# Patient Record
Sex: Male | Born: 1960 | Race: Black or African American | Hispanic: No | Marital: Married | Smoking: Never smoker
Health system: Southern US, Community
[De-identification: ages and names within clinical notes are randomized; demographics above are authoritative.]

## PROBLEM LIST (undated history)

## (undated) DIAGNOSIS — I10 Essential (primary) hypertension: Secondary | ICD-10-CM

---

## 2015-12-17 ENCOUNTER — Emergency Department (HOSPITAL_BASED_OUTPATIENT_CLINIC_OR_DEPARTMENT_OTHER): Payer: Self-pay

## 2015-12-17 ENCOUNTER — Emergency Department (HOSPITAL_BASED_OUTPATIENT_CLINIC_OR_DEPARTMENT_OTHER)
Admission: EM | Admit: 2015-12-17 | Discharge: 2015-12-18 | Disposition: A | Discharge status: Home / Self Care | Payer: Self-pay | Attending: Physician Assistant | Admitting: Physician Assistant

## 2015-12-17 ENCOUNTER — Encounter (HOSPITAL_BASED_OUTPATIENT_CLINIC_OR_DEPARTMENT_OTHER): Payer: Self-pay | Admitting: Emergency Medicine

## 2015-12-17 DIAGNOSIS — Z79899 Other long term (current) drug therapy: Secondary | ICD-10-CM | POA: Insufficient documentation

## 2015-12-17 DIAGNOSIS — R51 Headache: Secondary | ICD-10-CM | POA: Insufficient documentation

## 2015-12-17 DIAGNOSIS — M545 Low back pain: Secondary | ICD-10-CM | POA: Insufficient documentation

## 2015-12-17 DIAGNOSIS — R350 Frequency of micturition: Secondary | ICD-10-CM | POA: Insufficient documentation

## 2015-12-17 DIAGNOSIS — I10 Essential (primary) hypertension: Secondary | ICD-10-CM | POA: Insufficient documentation

## 2015-12-17 DIAGNOSIS — R6889 Other general symptoms and signs: Secondary | ICD-10-CM

## 2015-12-17 DIAGNOSIS — R200 Anesthesia of skin: Secondary | ICD-10-CM | POA: Insufficient documentation

## 2015-12-17 DIAGNOSIS — R42 Dizziness and giddiness: Secondary | ICD-10-CM | POA: Insufficient documentation

## 2015-12-17 HISTORY — DX: Essential (primary) hypertension: I10

## 2015-12-17 MED ORDER — SODIUM CHLORIDE 0.9 % IV BOLUS (SEPSIS)
1000.0000 mL | Freq: Once | INTRAVENOUS | Status: AC
  Administered 2015-12-18: 1000 mL via INTRAVENOUS

## 2015-12-17 NOTE — ED Notes (Signed)
Pt here with cousin interpreting. Has not been feeling well x 1 week. H/A this a.m. Got up and "fell over". No problems urinating, but no BM x 3-4 days. Abd distended. "Swells after eating". Neuro WNL.

## 2015-12-17 NOTE — ED Notes (Signed)
Pt only speaks [place], spoke with [place] creole interpreter id # [phone], per patient he states he has difficulty ambulating and becomes dizzy when walking, pt reports the has been in [place] for 3 months, has been to 4 different hospitals in last 2 months for problem and has just been given bp medication, he then stated that he has continued to have stomach problems and goes to bathroom every 3 days for bm, pt unable to tell me exact problem of stomach problems prior to [place], pt denies muscle or bone problem or injury for difficulty walking

## 2015-12-17 NOTE — ED Provider Notes (Addendum)
CSN: [id number]     Arrival date & time 12/17/15  1738 History  By signing my name below, I, Arianna Nassar, attest that this documentation has been prepared under the direction and in the presence of Thatiana Renbarger Randall An, MD. Electronically Signed: Octavia Heir, ED Scribe. 12/17/2015. 10:23 PM.      Chief Complaint  Patient presents with  . Dizziness  . Difficulty Walking      The history is provided by the patient. A language interpreter was used (#[id number]).   HPI Comments: Gabriel Ruiz is a 55 y.o. male who has a hx of HTN presents to the Emergency Department complaining of constant, gradual worsening dizziness with associated headache, lower back pain, and difficulty walking onset 3 months ago. He says his lower legs feel numb. Pt states he has seen three doctors for the same symptoms without finding the problems. He says that he has difficulty ambulating due to increased pain in lower back and legs. Pt reports that whenever he eats, he feels very bloated and he states having frequent urination about every 35 minutes. Pt is worried that he has a BM every 3-4 days. Pt has been in the [place] from [place] for about 3 years and he did not have this problem before. He denies muscle or bone injury.  Past Medical History  Diagnosis Date  . Hypertension    History reviewed. No pertinent past surgical history. History reviewed. No pertinent family history. Social History  Substance Use Topics  . Smoking status: Never Smoker   . Smokeless tobacco: None  . Alcohol Use: No    Review of Systems  Genitourinary: Positive for frequency.  Neurological: Positive for dizziness, numbness and headaches.  All other systems reviewed and are negative.     Allergies  Review of patient's allergies indicates no known allergies.  Home Medications   Prior to Admission medications   Medication Sig Start Date End Date Taking? Authorizing Provider  hydrochlorothiazide (HYDRODIURIL) 25  MG tablet Take 25 mg by mouth daily.   Yes Historical Provider, MD  omeprazole (PRILOSEC) 20 MG capsule Take 20 mg by mouth daily.   Yes Historical Provider, MD  traMADol (ULTRAM) 50 MG tablet Take by mouth every 6 (six) hours as needed.   Yes Historical Provider, MD   Triage vitals: BP 132/87 mmHg  Pulse 93  Temp(Src) 98 F (36.7 C) (Oral)  Resp 18  Ht  (1.676 m)  Wt 190 lb (86.183 kg)  BMI 30.68 kg/m2  SpO2 98% Physical Exam  Constitutional: He is oriented to person, place, and time. He appears well-developed and well-nourished.  HENT:  Head: Normocephalic and atraumatic.  Mouth/Throat: Oropharynx is clear and moist.  Eyes: Conjunctivae and EOM are normal. Pupils are equal, round, and reactive to light.  Neck: Normal range of motion.  Cardiovascular: Normal rate, regular rhythm and normal heart sounds.   Pulmonary/Chest: Effort normal and breath sounds normal.  Abdominal: Soft. Bowel sounds are normal.  Musculoskeletal: Normal range of motion.  Neurological: He is alert and oriented to person, place, and time.  Skin: Skin is warm and dry.  Psychiatric: He has a normal mood and affect.  Nursing note and vitals reviewed.   ED Course  Procedures  DIAGNOSTIC STUDIES: Oxygen Saturation is 98% on RA, normal by my interpretation.  COORDINATION OF CARE:  10:19 PM Discussed treatment plan with pt at bedside and pt agreed to plan.  Labs Review Labs Reviewed  URINE CULTURE  CBC WITH DIFFERENTIAL/PLATELET  COMPREHENSIVE METABOLIC PANEL  URINALYSIS, ROUTINE W REFLEX MICROSCOPIC (NOT AT [facility])    Imaging Review No results found. I have personally reviewed and evaluated these images and lab results as part of my medical decision-making.   EKG Interpretation None      MDM   Final diagnoses:  None    Patient is a 55 year old [place] speaking male. Intervening the patient was incredibly difficult. Patient was pan positive. Everything that you asked the patient he would  endorse as a symptom. For example when asked about bowel movements he says he only has one every 2-3 days. Or when asked about urination he says he urinates every 45 minutes. Patient says he's had a workup done at 3 different hospitals within last month. Reportedly none of them have found anything acutely needing treatment. When asked to narrow down what his complaints are he said mostly his headache and pain in his back.  We will get CT head, x-ray of his spine. Patient's physical exam showed no weakness or trouble with sensation. Cranial nerves are intact.  [facility] because the story and hisoty is so incredibly vague even with an interperter that I am having trouble directing testing.   We will get baseline labs, CT head and x-ray of his spine. This is all normal we'll have him try to establish care with her primary care physician.  Could not get labs. Did US guided fem stick.   Normal vital signs and physical exam except for scattered lesions on mouth.   I personally performed the services described in this documentation, which was scribed in my presence. The recorded information has been reviewed and is accurate.   Labs and all imaging pending, signed out to oncoming provider.   12:15 AM [facility]. 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 Nothing to be done. 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     Yer Olivencia Randall AnLyn Mylez Venable, MD 12/18/15 0000  Junious Ragone Randall AnLyn Anakaren Campion, MD 12/18/15 [id number]

## 2015-12-17 NOTE — ED Notes (Signed)
Femoral vein stick done by Dr. Corlis LeakMacKuen using Ultrasound.

## 2015-12-18 LAB — COMPREHENSIVE METABOLIC PANEL
ALBUMIN: 3.9 g/dL (ref 3.5–5.0)
ALK PHOS: 52 U/L (ref 38–126)
ALT: 25 U/L (ref 17–63)
AST: 43 U/L — AB (ref 15–41)
Anion gap: 8 (ref 5–15)
BILIRUBIN TOTAL: 0.6 mg/dL (ref 0.3–1.2)
BUN: 26 mg/dL — AB (ref 6–20)
CO2: 26 mmol/L (ref 22–32)
CREATININE: 1.39 mg/dL — AB (ref 0.61–1.24)
Calcium: 9.3 mg/dL (ref 8.9–10.3)
Chloride: 98 mmol/L — ABNORMAL LOW (ref 101–111)
GFR calc Af Amer: >60 mL/min (ref >60)
GFR, EST NON AFRICAN AMERICAN: 57 mL/min — AB (ref >60)
GLUCOSE: 101 mg/dL — AB (ref 65–99)
Potassium: 3.7 mmol/L (ref 3.5–5.1)
Sodium: 132 mmol/L — ABNORMAL LOW (ref 135–145)
TOTAL PROTEIN: 8.2 g/dL — AB (ref 6.5–8.1)

## 2015-12-18 LAB — URINALYSIS, ROUTINE W REFLEX MICROSCOPIC
Bilirubin Urine: NEGATIVE
GLUCOSE, UA: NEGATIVE mg/dL
HGB URINE DIPSTICK: NEGATIVE
KETONES UR: NEGATIVE mg/dL
Leukocytes, UA: NEGATIVE
Nitrite: NEGATIVE
PROTEIN: NEGATIVE mg/dL
Specific Gravity, Urine: 1.011 (ref 1.005–1.030)
pH: 6.5 (ref 5.0–8.0)

## 2015-12-18 LAB — CBC WITH DIFFERENTIAL/PLATELET
BASOS ABS: 0 10*3/uL (ref 0.0–0.1)
Basophils Relative: 1 %
Eosinophils Absolute: 0.1 10*3/uL (ref 0.0–0.7)
Eosinophils Relative: 3 %
HEMATOCRIT: 33 % — AB (ref 39.0–52.0)
HEMOGLOBIN: 11.5 g/dL — AB (ref 13.0–17.0)
LYMPHS PCT: 32 %
Lymphs Abs: 1.1 10*3/uL (ref 0.7–4.0)
MCH: 33.5 pg (ref 26.0–34.0)
MCHC: 34.8 g/dL (ref 30.0–36.0)
MCV: 96.2 fL (ref 78.0–100.0)
MONO ABS: 0.5 10*3/uL (ref 0.1–1.0)
Monocytes Relative: 15 %
NEUTROS ABS: 1.7 10*3/uL (ref 1.7–7.7)
NEUTROS PCT: 49 %
Platelets: 137 10*3/uL — ABNORMAL LOW (ref 150–400)
RBC: 3.43 MIL/uL — AB (ref 4.22–5.81)
RDW: 12.1 % (ref 11.5–15.5)
WBC: 3.3 10*3/uL — AB (ref 4.0–10.5)

## 2015-12-18 NOTE — ED Provider Notes (Addendum)
Nursing notes and vitals signs, including pulse oximetry, reviewed.  Summary of this visit's results, reviewed by myself:   EKG Interpretation  Date/Time:  Wednesday December 18 2015 01:23:12 EDT Ventricular Rate:  86 PR Interval:  178 QRS Duration: 99 QT Interval:  366 QTC Calculation: 438 R Axis:   17 Text Interpretation:  Sinus rhythm ST elev, probable normal early repol pattern Baseline wander in lead(s) V4 V5 No previous ECGs available Confirmed by Poetry Cerro  MD, Jonny RuizJOHN ([id number]) on 12/18/2015 1:25:53 AM       Labs:  Results for orders placed or performed during the hospital encounter of 12/17/15 (from the past 24 hour(s))  CBC with Differential/Platelet     Status: Abnormal   Collection Time: 12/17/15 11:50 PM  Result Value Ref Range   WBC 3.3 (L) 4.0 - 10.5 K/uL   RBC 3.43 (L) 4.22 - 5.81 MIL/uL   Hemoglobin 11.5 (L) 13.0 - 17.0 g/dL   HCT 65.733.0 (L) 84.639.0 - 96.252.0 %   MCV 96.2 78.0 - 100.0 fL   MCH 33.5 26.0 - 34.0 pg   MCHC 34.8 30.0 - 36.0 g/dL   RDW 95.212.1 84.111.5 - 32.415.5 %   Platelets 137 (L) 150 - 400 K/uL   Neutrophils Relative % 49 %   Neutro Abs 1.7 1.7 - 7.7 K/uL   Lymphocytes Relative 32 %   Lymphs Abs 1.1 0.7 - 4.0 K/uL   Monocytes Relative 15 %   Monocytes Absolute 0.5 0.1 - 1.0 K/uL   Eosinophils Relative 3 %   Eosinophils Absolute 0.1 0.0 - 0.7 K/uL   Basophils Relative 1 %   Basophils Absolute 0.0 0.0 - 0.1 K/uL  Comprehensive metabolic panel     Status: Abnormal   Collection Time: 12/17/15 11:50 PM  Result Value Ref Range   Sodium 132 (L) 135 - 145 mmol/L   Potassium 3.7 3.5 - 5.1 mmol/L   Chloride 98 (L) 101 - 111 mmol/L   CO2 26 22 - 32 mmol/L   Glucose, Bld 101 (H) 65 - 99 mg/dL   BUN 26 (H) 6 - 20 mg/dL   Creatinine, Ser 4.011.39 (H) 0.61 - 1.24 mg/dL   Calcium 9.3 8.9 - 02.710.3 mg/dL   Total Protein 8.2 (H) 6.5 - 8.1 g/dL   Albumin 3.9 3.5 - 5.0 g/dL   AST 43 (H) 15 - 41 U/L   ALT 25 17 - 63 U/L   Alkaline Phosphatase 52 38 - 126 U/L   Total Bilirubin 0.6 0.3  - 1.2 mg/dL   GFR calc non Af Amer 57 (L) >60 mL/min   GFR calc Af Amer >60 >60 mL/min   Anion gap 8 5 - 15  Urinalysis, Routine w reflex microscopic (not at [facility])     Status: Abnormal   Collection Time: 12/18/15 12:15 AM  Result Value Ref Range   Color, Urine YELLOW YELLOW   APPearance CLOUDY (A) CLEAR   Specific Gravity, Urine 1.011 1.005 - 1.030   pH 6.5 5.0 - 8.0   Glucose, UA NEGATIVE NEGATIVE mg/dL   Hgb urine dipstick NEGATIVE NEGATIVE   Bilirubin Urine NEGATIVE NEGATIVE   Ketones, ur NEGATIVE NEGATIVE mg/dL   Protein, ur NEGATIVE NEGATIVE mg/dL   Nitrite NEGATIVE NEGATIVE   Leukocytes, UA NEGATIVE NEGATIVE    Imaging Studies: Dg Chest 2 View  12/18/2015  CLINICAL DATA:  Constant gradual worsening dizziness with headache, chest pain, back pain, low leg pain and numbness. Onset 3 months ago. EXAM: CHEST  2  VIEW COMPARISON:  11/02/2015 FINDINGS: Shallow inspiration with atelectasis in the lung bases. No focal airspace disease or consolidation in the lungs. No blunting of costophrenic angles. No pneumothorax. Heart size and pulmonary vascularity are normal. IMPRESSION: Shallow inspiration with atelectasis in the lung bases. Electronically Signed   By: Burman Nieves M.D.   On: 12/18/2015 00:56   Dg Lumbar Spine Complete  12/18/2015  CLINICAL DATA:  55 year old male with back pain and difficulty walking. EXAM: LUMBAR SPINE - COMPLETE 4+ VIEW COMPARISON:  None. FINDINGS: There is mild compression deformity of the L4 vertebra, age indeterminate, likely chronic. Clinical correlation is recommended. A small density noted along the posterior and inferior corner of the L4 vertebra with apparent mild protrusion of the neural foramina at this level. MRI may provide better evaluation. No definite acute vertebral body fracture or subluxation identified. The visualized transverse and spinous processes appear intact. Copious amount of dense stool noted throughout the colon. IMPRESSION: Apparent  faint density along the posterior and inferior corner of the L4 vertebra protruding onto the neural foramina. MRI may provide better evaluation No definite acute vertebral body fracture or subluxation. Electronically Signed   By: Elgie Collard M.D.   On: 12/18/2015 00:56   Ct Head Wo Contrast  12/18/2015  CLINICAL DATA:  55 year old male with dizziness.  No head injury. EXAM: CT HEAD WITHOUT CONTRAST TECHNIQUE: Contiguous axial images were obtained from the base of the skull through the vertex without intravenous contrast. COMPARISON:  None. FINDINGS: The ventricles and the sulci are appropriate in size for the patient's age. There is no intracranial hemorrhage. No midline shift or mass effect identified. The gray-white matter differentiation is preserved. The visualized paranasal sinuses and mastoid air cells are well aerated. The calvarium is intact. IMPRESSION: No acute intracranial pathology. Electronically Signed   By: Elgie Collard M.D.   On: 12/18/2015 00:46   The patient's mild leukopenia has been noted on previous CBCs and has been worse in the past. There are no lab or x-ray findings suggesting a need for admission or emergent intervention.   Paula Libra, MD 12/18/15 [id number]  Paula Libra, MD 12/18/15 0130

## 2015-12-18 NOTE — ED Notes (Signed)
By use of interpreter services, pt was given d/c instructions. Verbalized understanding. No questions.

## 2015-12-19 LAB — URINE CULTURE: CULTURE: NO GROWTH

## 2017-03-20 IMAGING — CR DG CHEST 2V
3 series · 3 of 3 positions shown · non-contrast
Comparison: 11/02/2015

CLINICAL DATA: Constant gradual worsening dizziness with headache,
chest pain, back pain, low leg pain and numbness. Onset 3 months
ago.

EXAM:
CHEST  2 VIEW

[w chest pa]
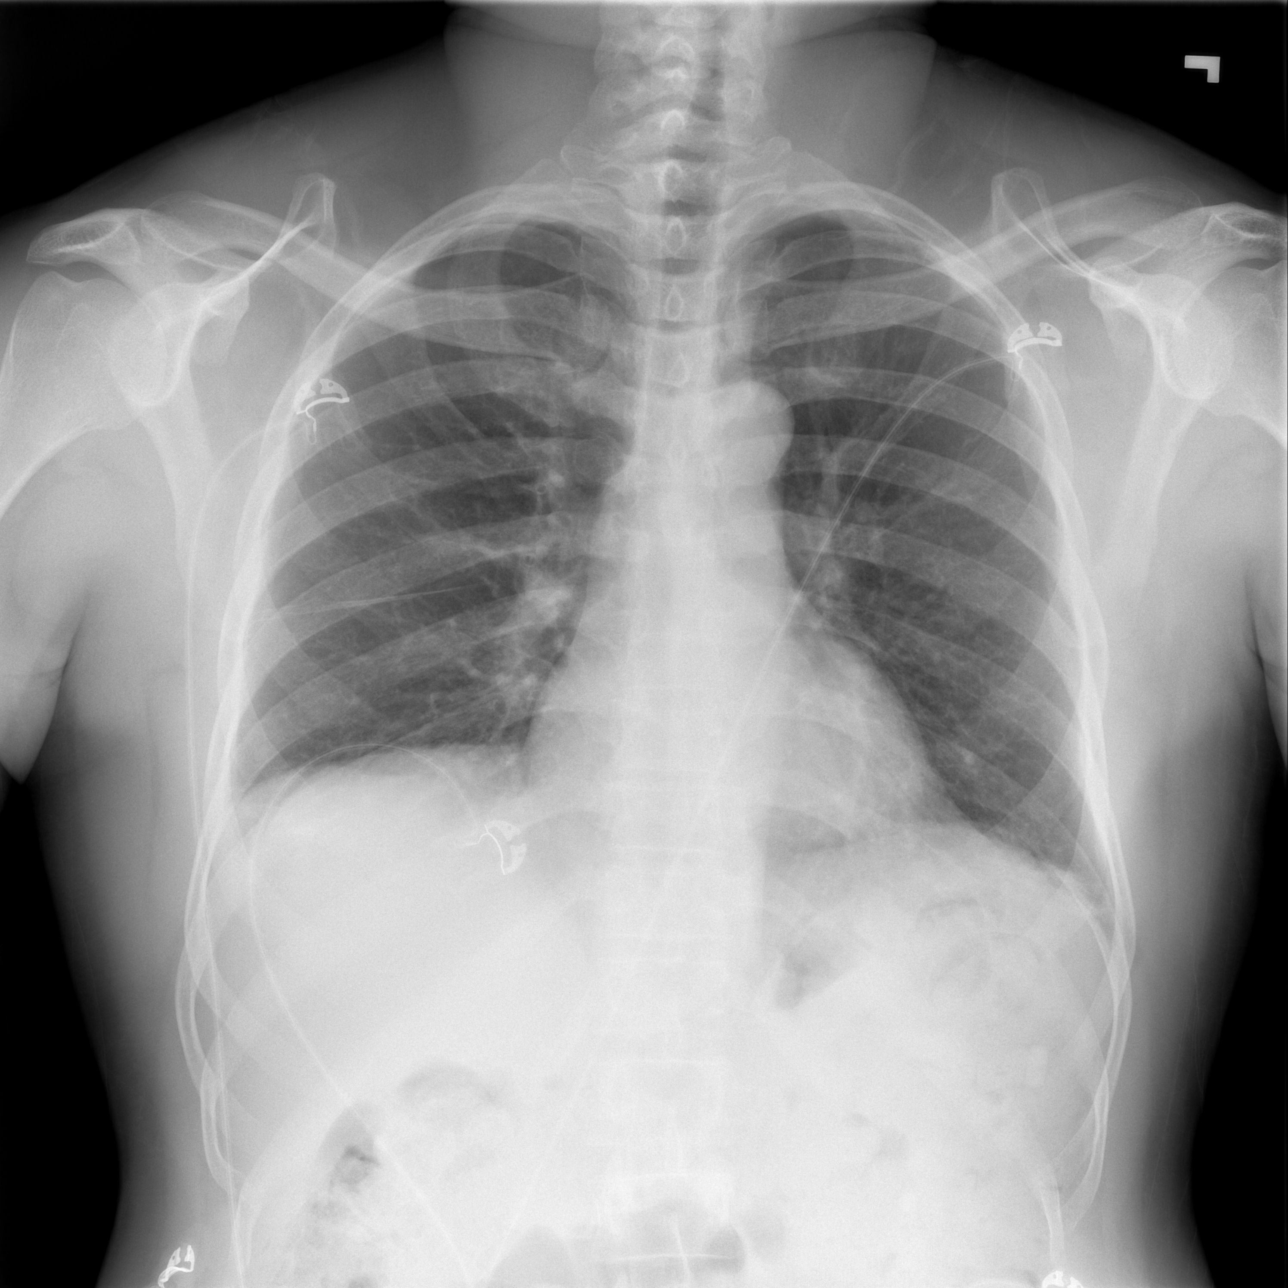

[w chest lat (1 of 2)]
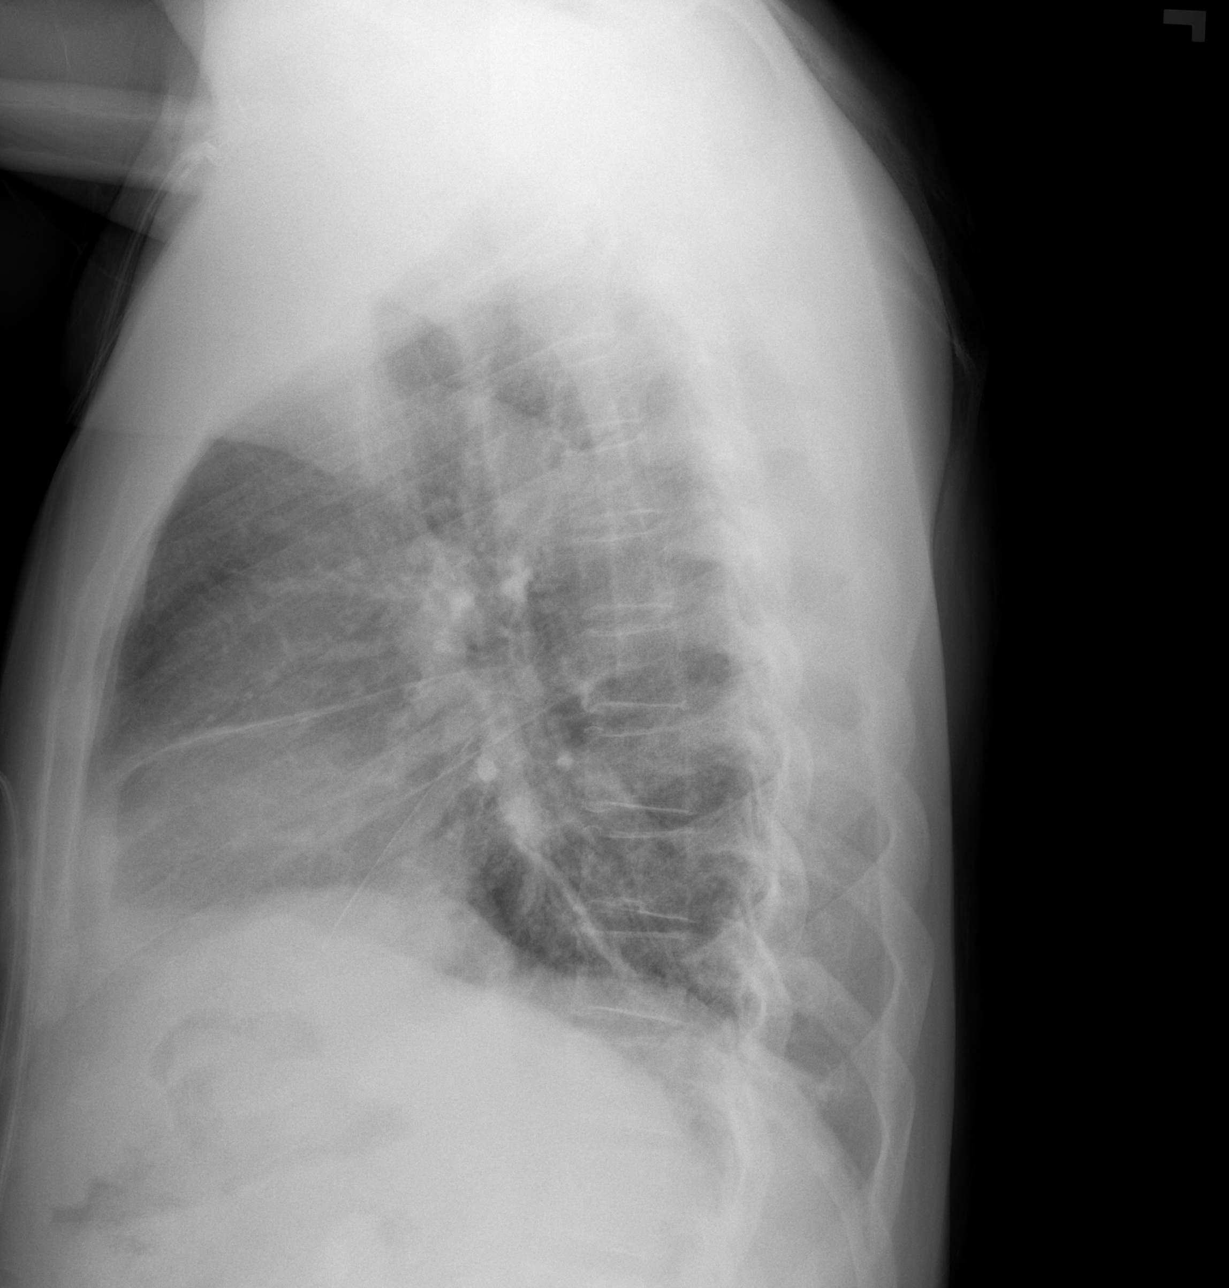

[w chest lat (2 of 2)]
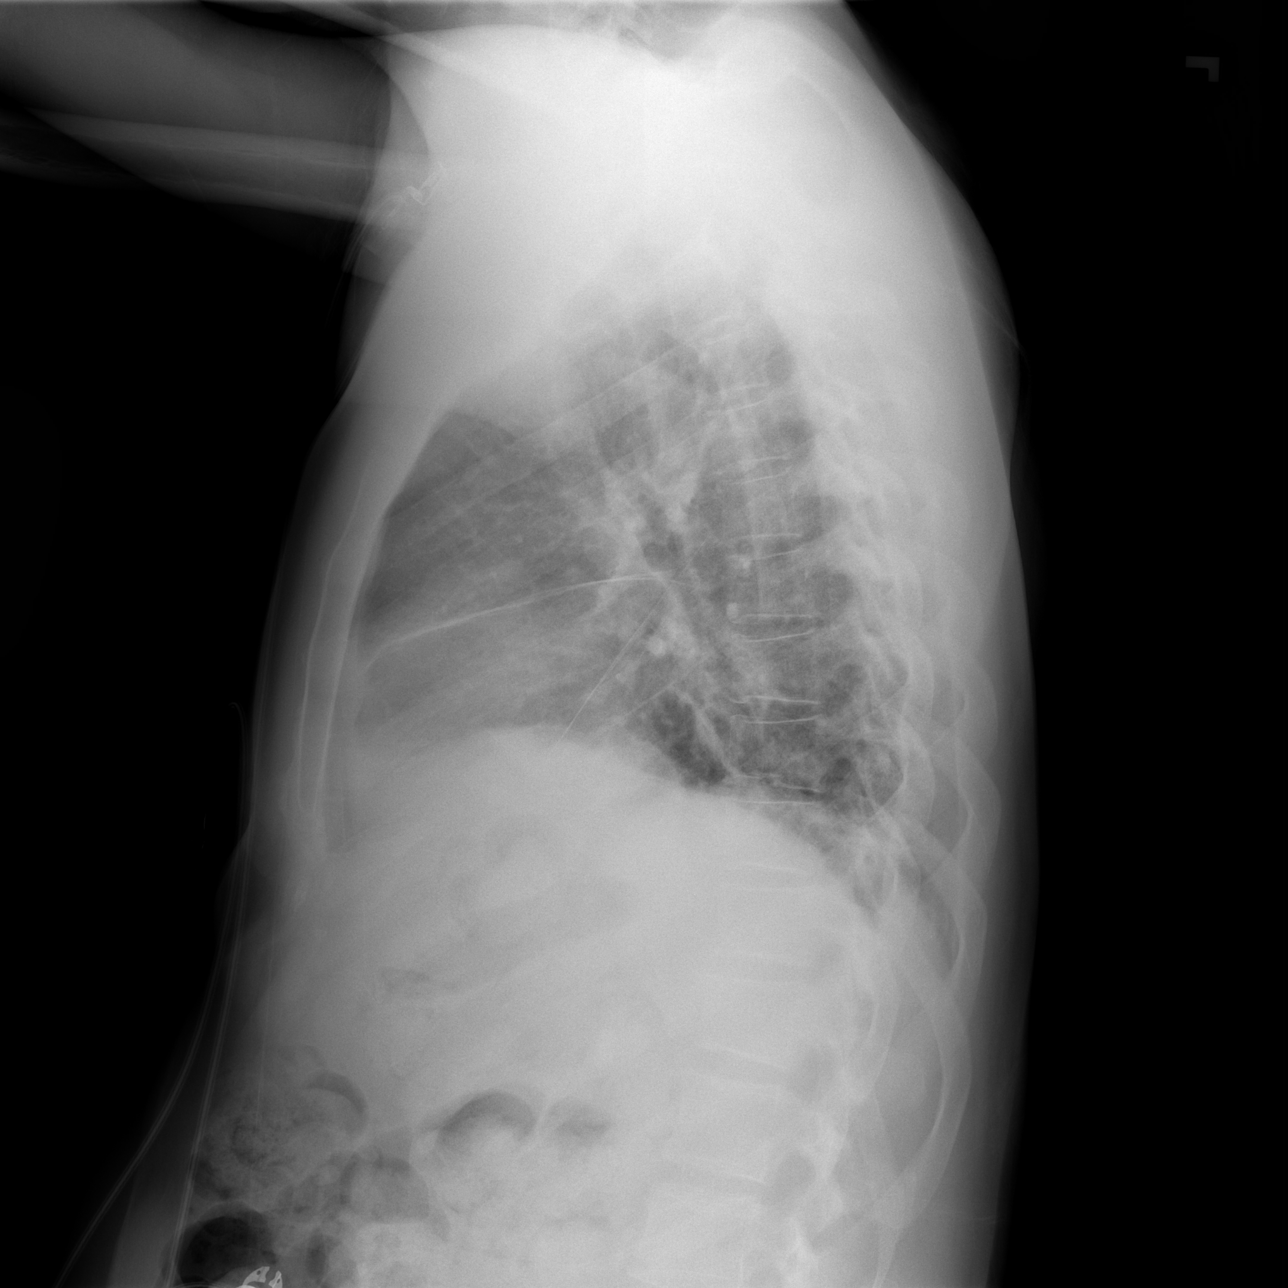

[3 of 3 positions shown; findings below may reference images not displayed]

FINDINGS: Shallow inspiration with atelectasis in the lung bases. No focal
airspace disease or consolidation in the lungs. No blunting of
costophrenic angles. No pneumothorax. Heart size and pulmonary
vascularity are normal.
IMPRESSION: Shallow inspiration with atelectasis in the lung bases.

## 2017-03-20 IMAGING — CT CT HEAD W/O CM
1 of 2 series · 16 of 30 positions shown, 20 images · non-contrast
Comparison: None.

CLINICAL DATA: 54-year-old male with dizziness.  No head injury.

EXAM:
CT HEAD WITHOUT CONTRAST
TECHNIQUE: Contiguous axial images were obtained from the base of the skull
through the vertex without intravenous contrast.

[Series 4: head wo · axial · 0.46mm/px · z∈[-134,-8]mm · 16 of 30 slices shown, 20 images]
[im 2/30  brain]
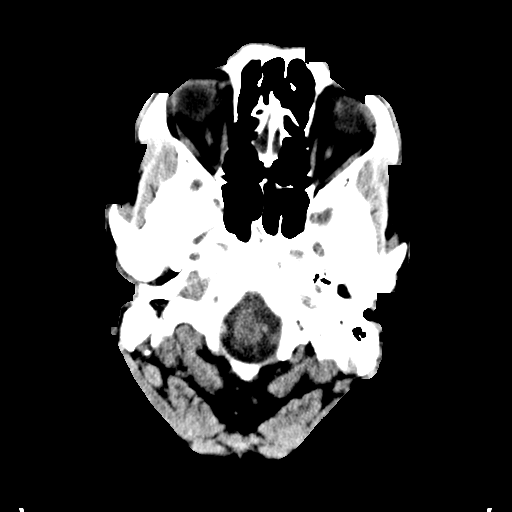
[im 2/30  bone]
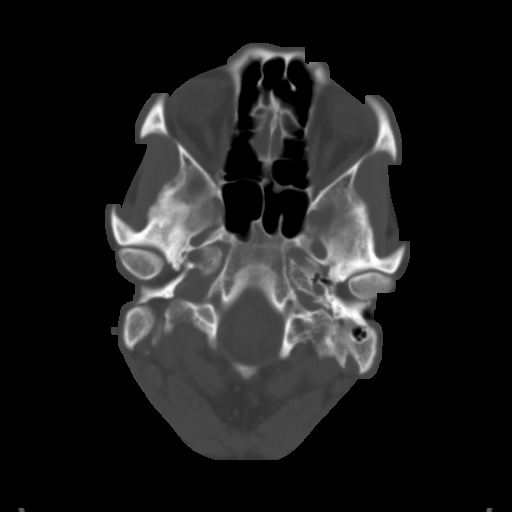
[im 4/30  brain]
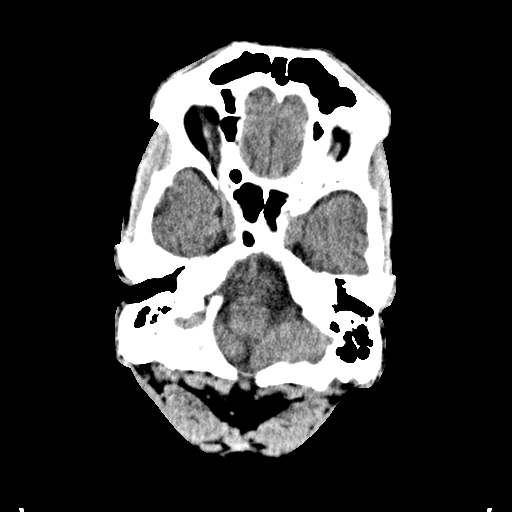
[im 5/30  brain]
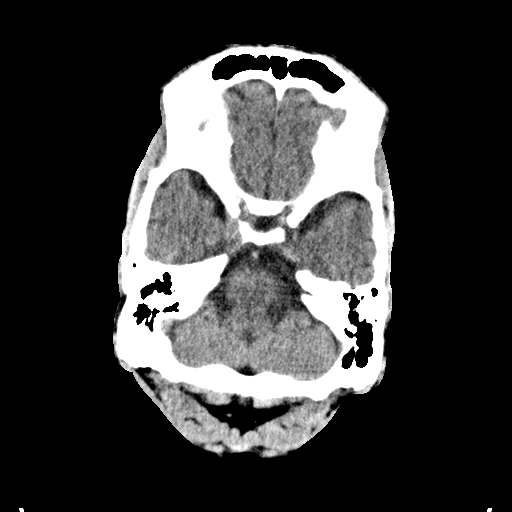
[im 8/30  brain]
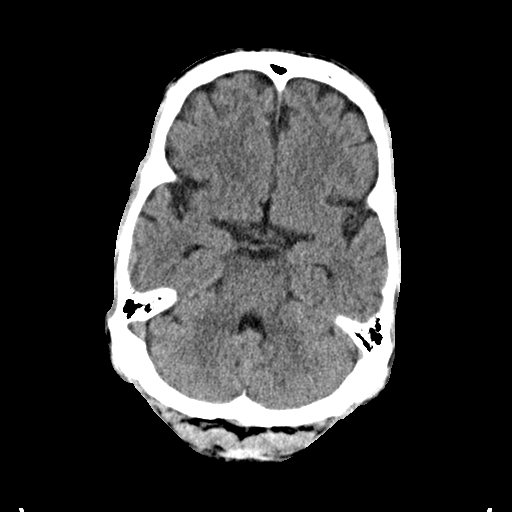
[im 9/30  brain]
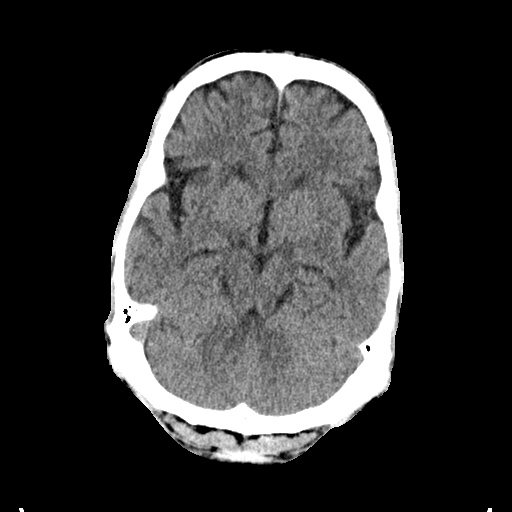
[im 9/30  bone]
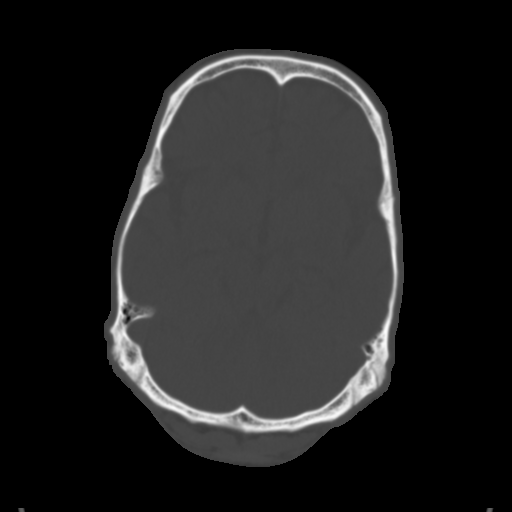
[im 10/30  brain]
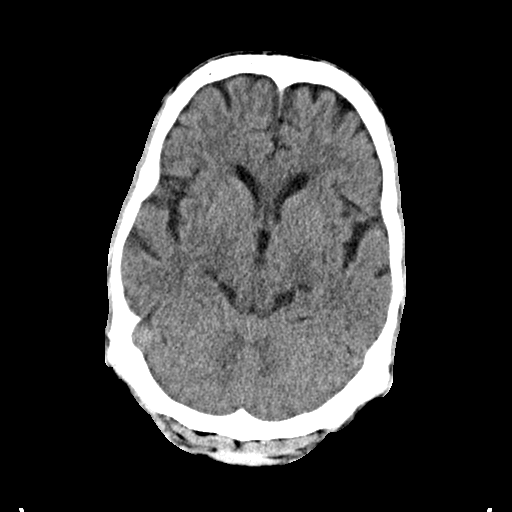
[im 13/30  brain]
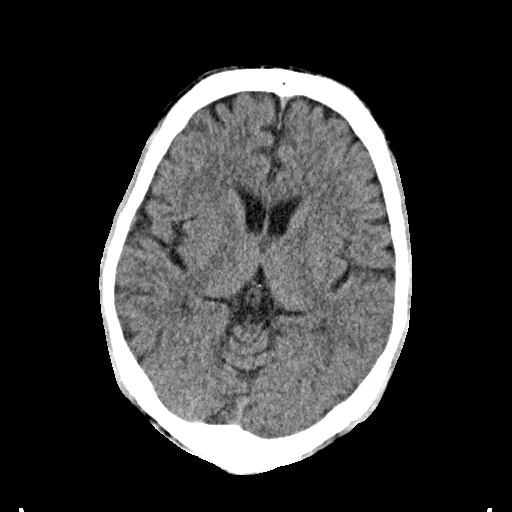
[im 14/30  brain]
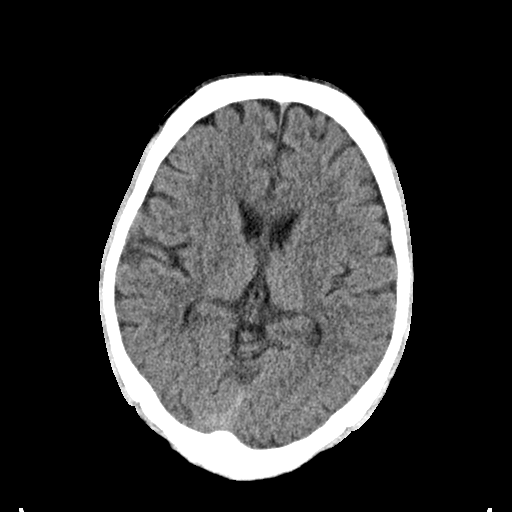
[im 16/30  brain]
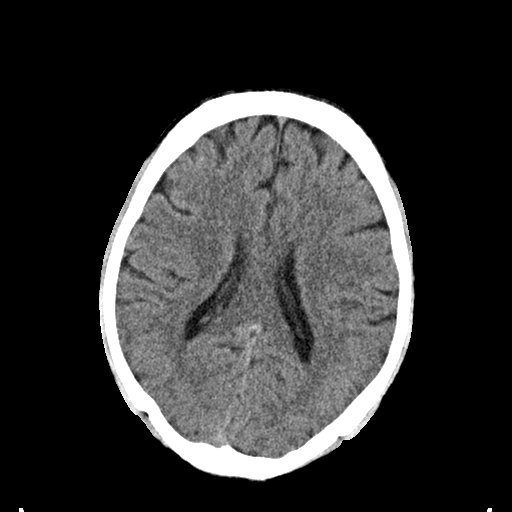
[im 16/30  bone]
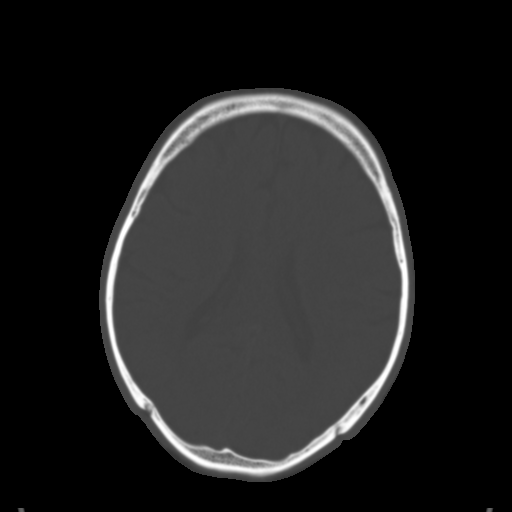
[im 17/30  brain]
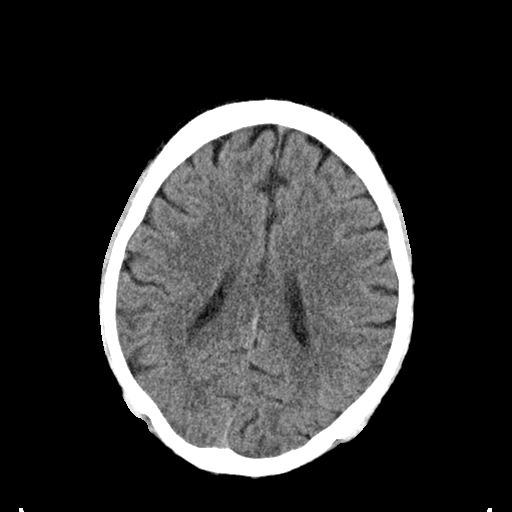
[im 20/30  brain]
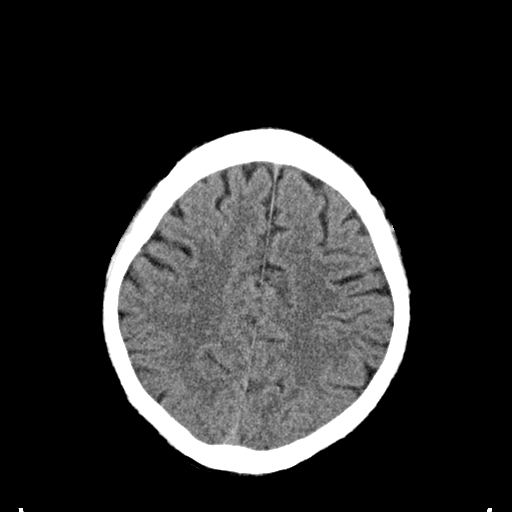
[im 21/30  brain]
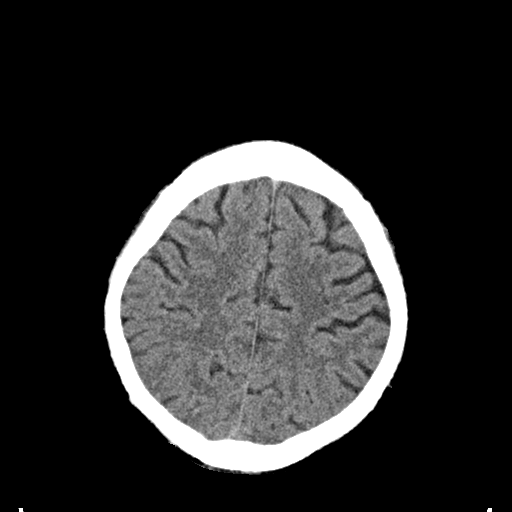
[im 22/30  brain]
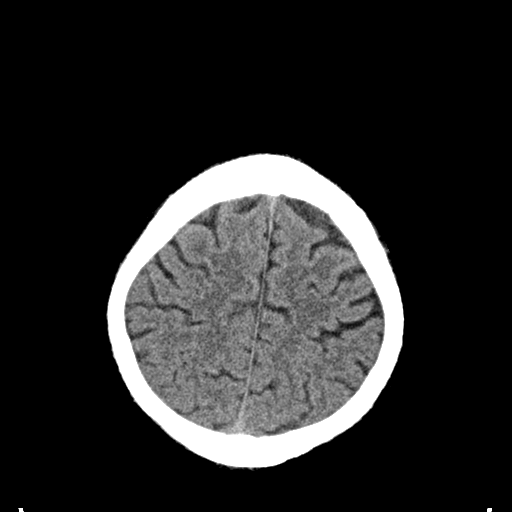
[im 22/30  bone]
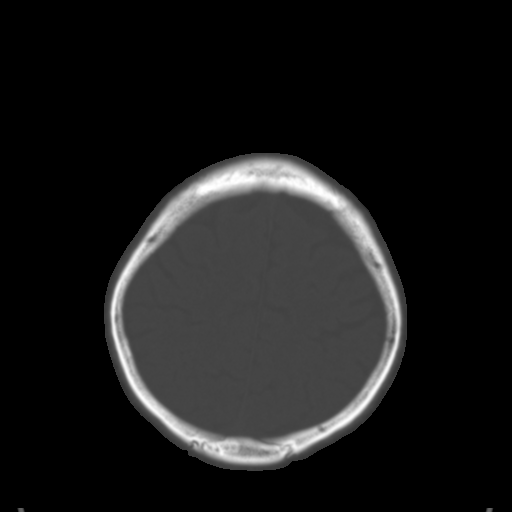
[im 25/30  brain]
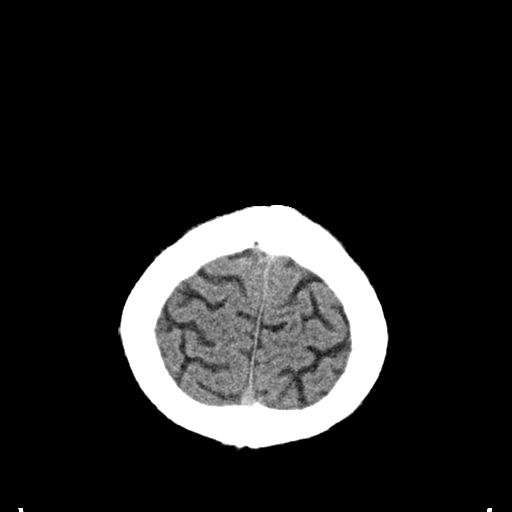
[im 26/30  brain]
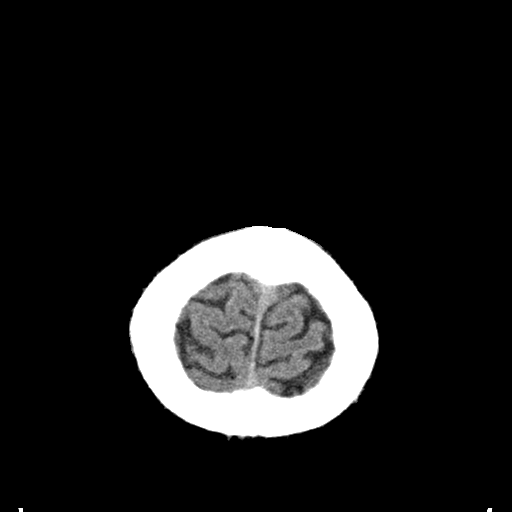
[im 28/30  brain]
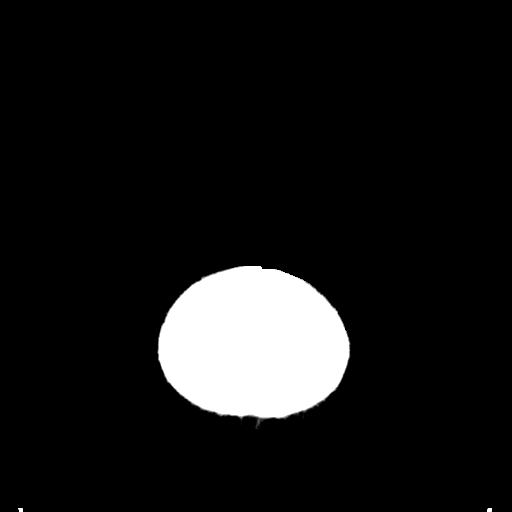

[16 of 30 positions shown; findings below may reference images not displayed]

FINDINGS: The ventricles and the sulci are appropriate in size for the
patient's age. There is no intracranial hemorrhage. No midline shift
or mass effect identified. The gray-white matter differentiation is
preserved.

The visualized paranasal sinuses and mastoid air cells are well
aerated. The calvarium is intact.
IMPRESSION: No acute intracranial pathology.

## 2017-03-20 IMAGING — CR DG LUMBAR SPINE COMPLETE 4+V
5 series · 5 of 5 positions shown · non-contrast
Comparison: None.

CLINICAL DATA: 54-year-old male with back pain and difficulty
walking.

EXAM:
LUMBAR SPINE - COMPLETE 4+ VIEW

[t l-spine a.p.]
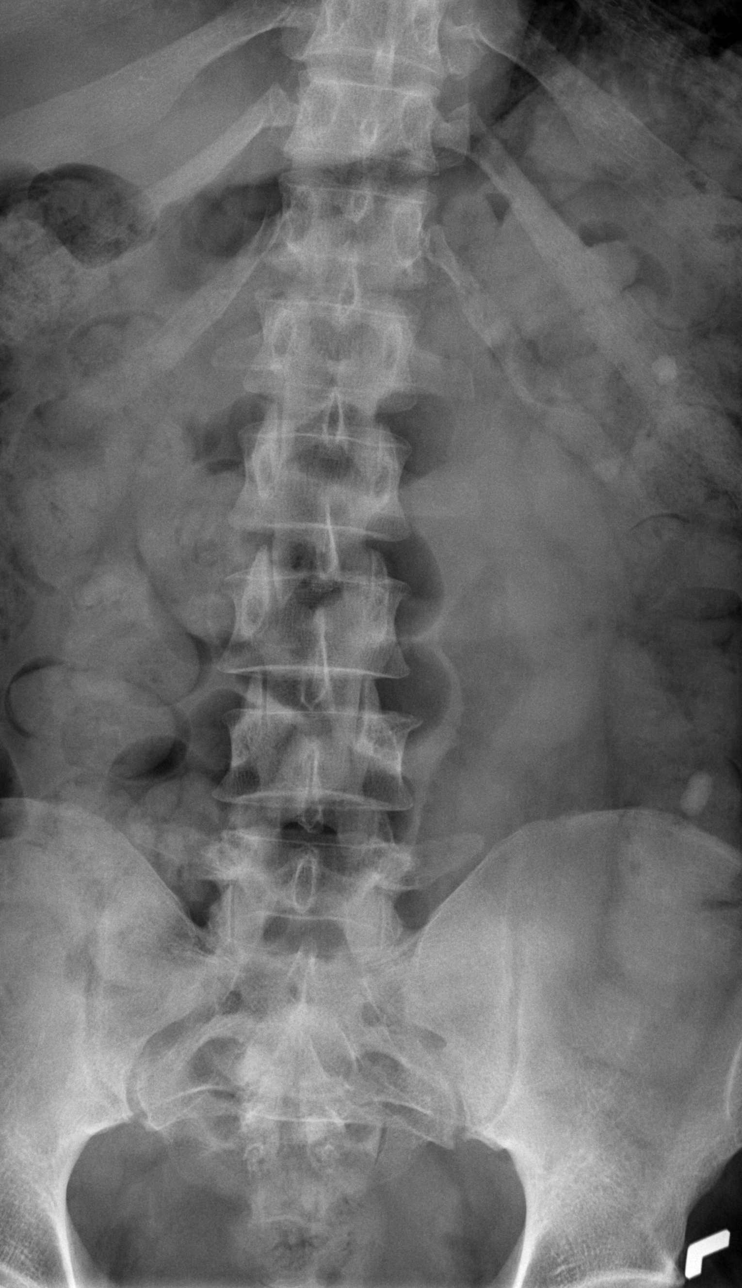

[t l-spine oblique exposure (1 of 2)]
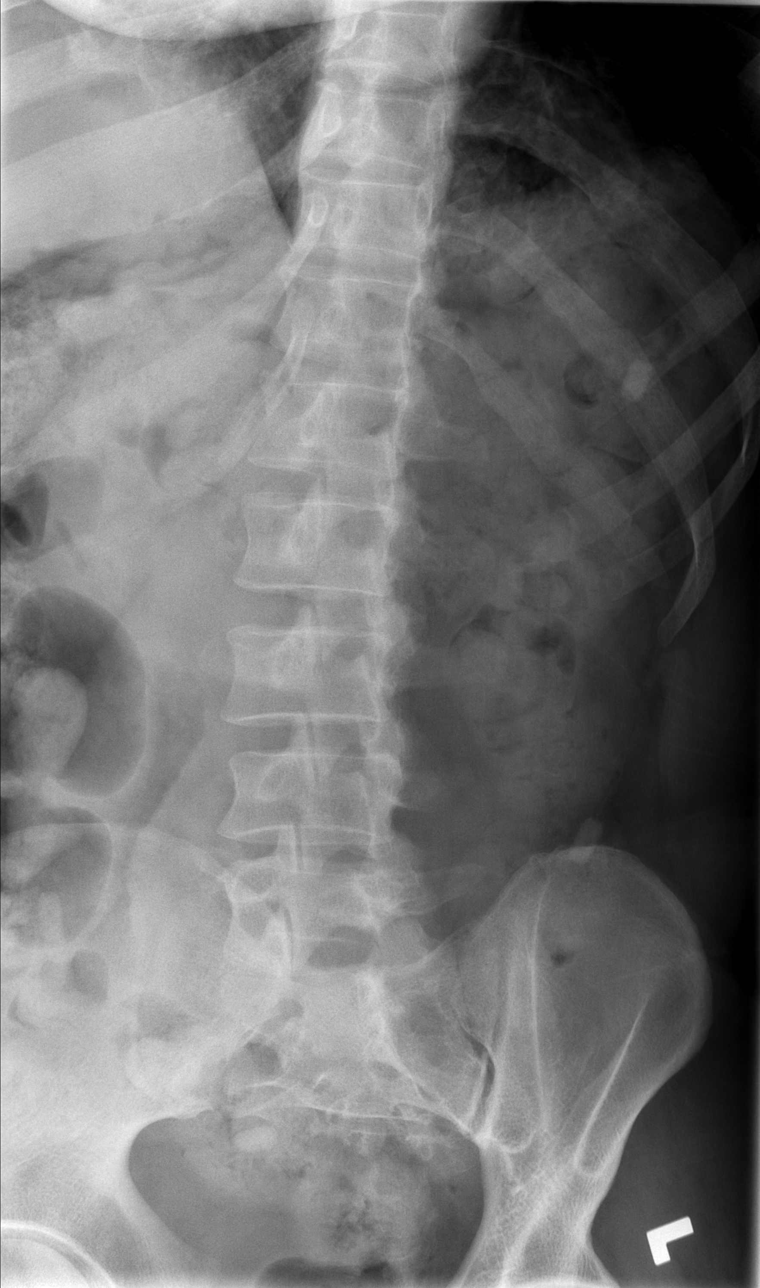

[t l-spine oblique exposure (2 of 2)]
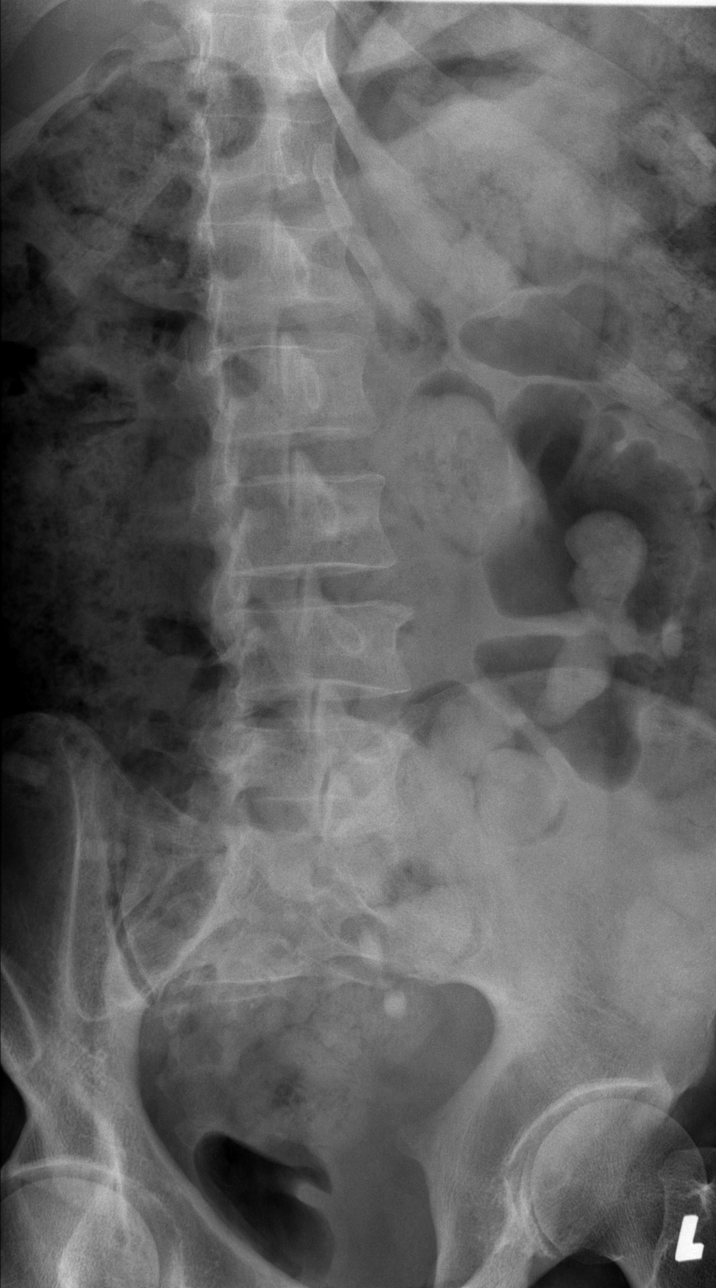

[t l-spine lat]
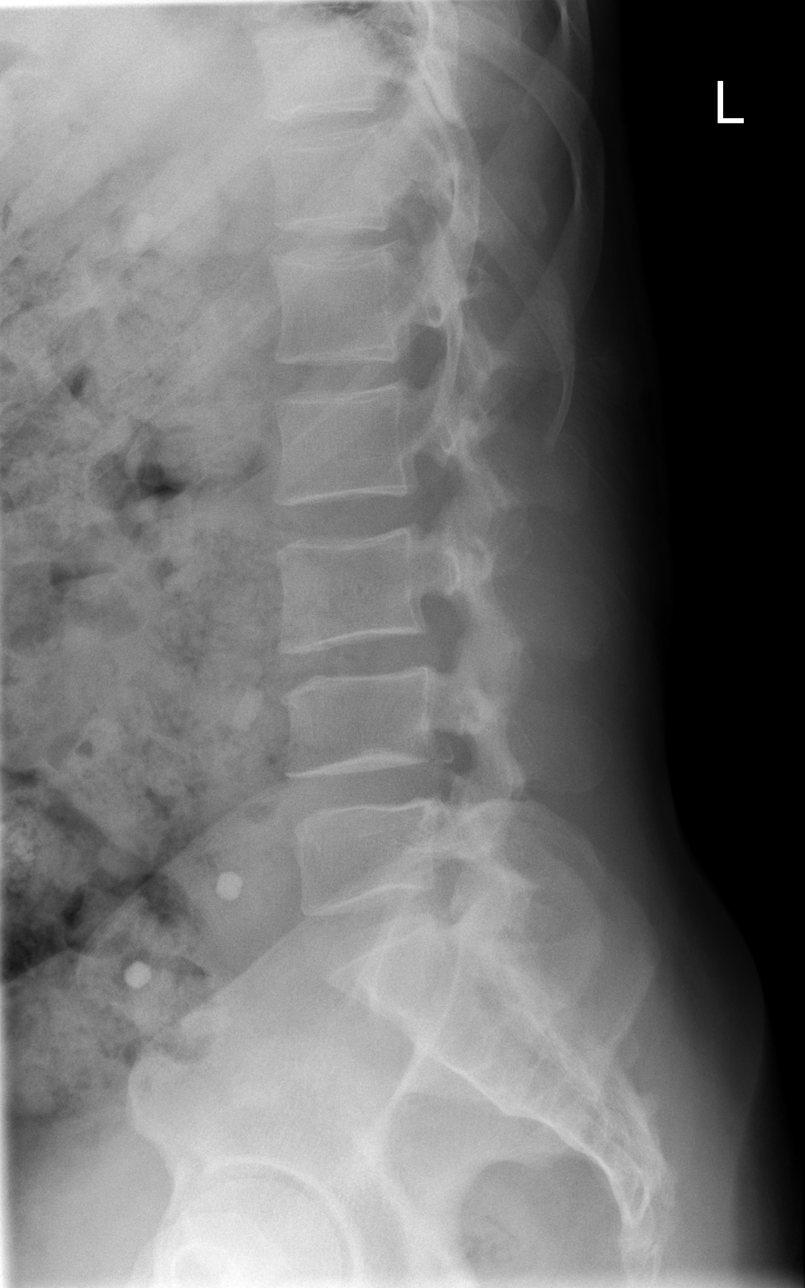

[t l-spine l5-s1 spot]
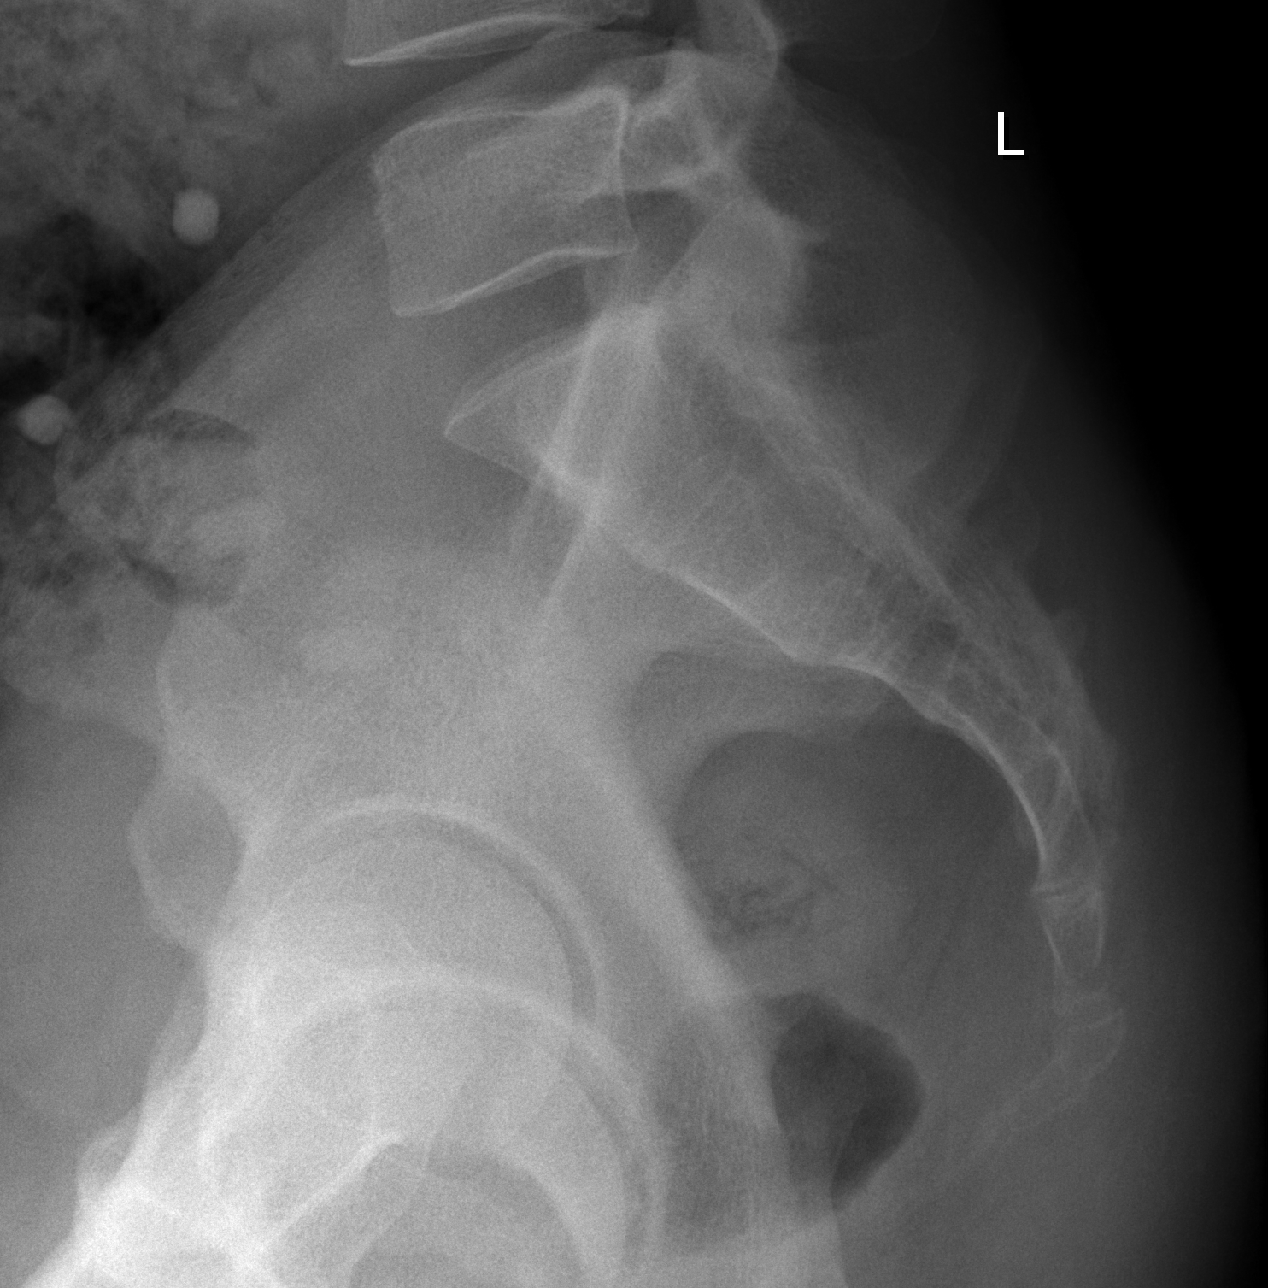

[5 of 5 positions shown; findings below may reference images not displayed]

FINDINGS: There is mild compression deformity of the L4 vertebra, age
indeterminate, likely chronic. Clinical correlation is recommended.
A small density noted along the posterior and inferior corner of the
L4 vertebra with apparent mild protrusion of the neural foramina at
this level. MRI may provide better evaluation. No definite acute
vertebral body fracture or subluxation identified. The visualized
transverse and spinous processes appear intact.

Copious amount of dense stool noted throughout the colon.
IMPRESSION: Apparent faint density along the posterior and inferior corner of
the L4 vertebra protruding onto the neural foramina. MRI may provide
better evaluation

No definite acute vertebral body fracture or subluxation.
